# Patient Record
Sex: Female | Born: 1950
Health system: Southern US, Community
[De-identification: ages and names within clinical notes are randomized; demographics above are authoritative.]

## PROBLEM LIST (undated history)

## (undated) DIAGNOSIS — H269 Unspecified cataract: Secondary | ICD-10-CM

## (undated) DIAGNOSIS — I10 Essential (primary) hypertension: Secondary | ICD-10-CM

## (undated) DIAGNOSIS — J329 Chronic sinusitis, unspecified: Secondary | ICD-10-CM

## (undated) DIAGNOSIS — M810 Age-related osteoporosis without current pathological fracture: Secondary | ICD-10-CM

## (undated) HISTORY — DX: Age-related osteoporosis without current pathological fracture: M81.0

## (undated) HISTORY — DX: Chronic sinusitis, unspecified: J32.9

## (undated) HISTORY — DX: Unspecified cataract: H26.9

## (undated) HISTORY — DX: Essential (primary) hypertension: I10

---

## 1998-04-24 ENCOUNTER — Other Ambulatory Visit: Admission: RE | Admit: 1998-04-24 | Discharge: 1998-04-24 | Payer: Self-pay | Admitting: Obstetrics & Gynecology

## 1999-05-06 ENCOUNTER — Other Ambulatory Visit: Admission: RE | Admit: 1999-05-06 | Discharge: 1999-05-06 | Payer: Self-pay | Admitting: Obstetrics & Gynecology

## 2000-05-11 ENCOUNTER — Other Ambulatory Visit: Admission: RE | Admit: 2000-05-11 | Discharge: 2000-05-11 | Payer: Self-pay | Admitting: Obstetrics & Gynecology

## 2001-05-12 ENCOUNTER — Other Ambulatory Visit: Admission: RE | Admit: 2001-05-12 | Discharge: 2001-05-12 | Payer: Self-pay | Admitting: Obstetrics & Gynecology

## 2002-05-18 ENCOUNTER — Other Ambulatory Visit: Admission: RE | Admit: 2002-05-18 | Discharge: 2002-05-18 | Payer: Self-pay | Admitting: Obstetrics & Gynecology

## 2003-05-24 ENCOUNTER — Other Ambulatory Visit: Admission: RE | Admit: 2003-05-24 | Discharge: 2003-05-24 | Payer: Self-pay | Admitting: Obstetrics & Gynecology

## 2004-05-27 ENCOUNTER — Other Ambulatory Visit: Admission: RE | Admit: 2004-05-27 | Discharge: 2004-05-27 | Payer: Self-pay | Admitting: Obstetrics & Gynecology

## 2007-08-19 HISTORY — PX: COLONOSCOPY: SHX5424

## 2013-12-27 DIAGNOSIS — E559 Vitamin D deficiency, unspecified: Secondary | ICD-10-CM | POA: Diagnosis not present

## 2013-12-27 DIAGNOSIS — Z0000 Encounter for general adult medical examination without abnormal findings: Secondary | ICD-10-CM | POA: Diagnosis not present

## 2013-12-27 DIAGNOSIS — M859 Disorder of bone density and structure, unspecified: Secondary | ICD-10-CM | POA: Diagnosis not present

## 2013-12-27 DIAGNOSIS — I10 Essential (primary) hypertension: Secondary | ICD-10-CM | POA: Diagnosis not present

## 2013-12-30 DIAGNOSIS — Z23 Encounter for immunization: Secondary | ICD-10-CM | POA: Diagnosis not present

## 2013-12-30 DIAGNOSIS — I10 Essential (primary) hypertension: Secondary | ICD-10-CM | POA: Diagnosis not present

## 2013-12-30 DIAGNOSIS — M8580 Other specified disorders of bone density and structure, unspecified site: Secondary | ICD-10-CM | POA: Diagnosis not present

## 2013-12-30 DIAGNOSIS — E559 Vitamin D deficiency, unspecified: Secondary | ICD-10-CM | POA: Diagnosis not present

## 2014-03-01 DIAGNOSIS — E559 Vitamin D deficiency, unspecified: Secondary | ICD-10-CM | POA: Diagnosis not present

## 2014-05-02 DIAGNOSIS — Z1289 Encounter for screening for malignant neoplasm of other sites: Secondary | ICD-10-CM | POA: Diagnosis not present

## 2014-05-02 DIAGNOSIS — Z1231 Encounter for screening mammogram for malignant neoplasm of breast: Secondary | ICD-10-CM | POA: Diagnosis not present

## 2014-06-30 DIAGNOSIS — Z23 Encounter for immunization: Secondary | ICD-10-CM | POA: Diagnosis not present

## 2014-06-30 DIAGNOSIS — E559 Vitamin D deficiency, unspecified: Secondary | ICD-10-CM | POA: Diagnosis not present

## 2014-06-30 DIAGNOSIS — I10 Essential (primary) hypertension: Secondary | ICD-10-CM | POA: Diagnosis not present

## 2014-06-30 DIAGNOSIS — H6121 Impacted cerumen, right ear: Secondary | ICD-10-CM | POA: Diagnosis not present

## 2014-12-26 DIAGNOSIS — Z23 Encounter for immunization: Secondary | ICD-10-CM | POA: Diagnosis not present

## 2015-03-07 DIAGNOSIS — J40 Bronchitis, not specified as acute or chronic: Secondary | ICD-10-CM | POA: Diagnosis not present

## 2015-03-07 DIAGNOSIS — H6502 Acute serous otitis media, left ear: Secondary | ICD-10-CM | POA: Diagnosis not present

## 2015-03-07 DIAGNOSIS — J329 Chronic sinusitis, unspecified: Secondary | ICD-10-CM | POA: Diagnosis not present

## 2015-06-26 DIAGNOSIS — E559 Vitamin D deficiency, unspecified: Secondary | ICD-10-CM | POA: Diagnosis not present

## 2015-06-26 DIAGNOSIS — Z0000 Encounter for general adult medical examination without abnormal findings: Secondary | ICD-10-CM | POA: Diagnosis not present

## 2015-06-26 DIAGNOSIS — I10 Essential (primary) hypertension: Secondary | ICD-10-CM | POA: Diagnosis not present

## 2015-07-02 DIAGNOSIS — Q438 Other specified congenital malformations of intestine: Secondary | ICD-10-CM | POA: Diagnosis not present

## 2015-07-02 DIAGNOSIS — D485 Neoplasm of uncertain behavior of skin: Secondary | ICD-10-CM | POA: Diagnosis not present

## 2015-07-02 DIAGNOSIS — I10 Essential (primary) hypertension: Secondary | ICD-10-CM | POA: Diagnosis not present

## 2015-07-02 DIAGNOSIS — E559 Vitamin D deficiency, unspecified: Secondary | ICD-10-CM | POA: Diagnosis not present

## 2015-07-11 DIAGNOSIS — L820 Inflamed seborrheic keratosis: Secondary | ICD-10-CM | POA: Diagnosis not present

## 2015-07-11 DIAGNOSIS — Z1283 Encounter for screening for malignant neoplasm of skin: Secondary | ICD-10-CM | POA: Diagnosis not present

## 2015-12-18 DIAGNOSIS — Z23 Encounter for immunization: Secondary | ICD-10-CM | POA: Diagnosis not present

## 2016-04-18 DIAGNOSIS — H2511 Age-related nuclear cataract, right eye: Secondary | ICD-10-CM | POA: Diagnosis not present

## 2016-04-18 DIAGNOSIS — H25012 Cortical age-related cataract, left eye: Secondary | ICD-10-CM | POA: Diagnosis not present

## 2016-04-18 DIAGNOSIS — H2512 Age-related nuclear cataract, left eye: Secondary | ICD-10-CM | POA: Diagnosis not present

## 2016-04-18 DIAGNOSIS — H25011 Cortical age-related cataract, right eye: Secondary | ICD-10-CM | POA: Diagnosis not present

## 2016-04-18 DIAGNOSIS — H25041 Posterior subcapsular polar age-related cataract, right eye: Secondary | ICD-10-CM | POA: Diagnosis not present

## 2016-05-08 DIAGNOSIS — Z1231 Encounter for screening mammogram for malignant neoplasm of breast: Secondary | ICD-10-CM | POA: Diagnosis not present

## 2016-05-08 DIAGNOSIS — Z1289 Encounter for screening for malignant neoplasm of other sites: Secondary | ICD-10-CM | POA: Diagnosis not present

## 2016-06-10 DIAGNOSIS — H18411 Arcus senilis, right eye: Secondary | ICD-10-CM | POA: Diagnosis not present

## 2016-06-10 DIAGNOSIS — I10 Essential (primary) hypertension: Secondary | ICD-10-CM | POA: Diagnosis not present

## 2016-06-10 DIAGNOSIS — H2513 Age-related nuclear cataract, bilateral: Secondary | ICD-10-CM | POA: Diagnosis not present

## 2016-06-10 DIAGNOSIS — H2511 Age-related nuclear cataract, right eye: Secondary | ICD-10-CM | POA: Diagnosis not present

## 2016-06-10 DIAGNOSIS — H02839 Dermatochalasis of unspecified eye, unspecified eyelid: Secondary | ICD-10-CM | POA: Diagnosis not present

## 2016-07-28 DIAGNOSIS — I10 Essential (primary) hypertension: Secondary | ICD-10-CM | POA: Diagnosis not present

## 2016-07-28 DIAGNOSIS — E559 Vitamin D deficiency, unspecified: Secondary | ICD-10-CM | POA: Diagnosis not present

## 2016-07-31 DIAGNOSIS — Z23 Encounter for immunization: Secondary | ICD-10-CM | POA: Diagnosis not present

## 2016-07-31 DIAGNOSIS — M8580 Other specified disorders of bone density and structure, unspecified site: Secondary | ICD-10-CM | POA: Diagnosis not present

## 2016-07-31 DIAGNOSIS — M859 Disorder of bone density and structure, unspecified: Secondary | ICD-10-CM | POA: Diagnosis not present

## 2016-07-31 DIAGNOSIS — I10 Essential (primary) hypertension: Secondary | ICD-10-CM | POA: Diagnosis not present

## 2016-07-31 DIAGNOSIS — E559 Vitamin D deficiency, unspecified: Secondary | ICD-10-CM | POA: Diagnosis not present

## 2016-07-31 DIAGNOSIS — Z888 Allergy status to other drugs, medicaments and biological substances status: Secondary | ICD-10-CM | POA: Diagnosis not present

## 2016-07-31 DIAGNOSIS — Z823 Family history of stroke: Secondary | ICD-10-CM | POA: Diagnosis not present

## 2016-08-11 DIAGNOSIS — Z1211 Encounter for screening for malignant neoplasm of colon: Secondary | ICD-10-CM | POA: Diagnosis not present

## 2016-08-11 DIAGNOSIS — Z1212 Encounter for screening for malignant neoplasm of rectum: Secondary | ICD-10-CM | POA: Diagnosis not present

## 2016-08-18 DIAGNOSIS — H2511 Age-related nuclear cataract, right eye: Secondary | ICD-10-CM | POA: Diagnosis not present

## 2016-08-18 DIAGNOSIS — H2589 Other age-related cataract: Secondary | ICD-10-CM | POA: Diagnosis not present

## 2016-08-19 DIAGNOSIS — H2512 Age-related nuclear cataract, left eye: Secondary | ICD-10-CM | POA: Diagnosis not present

## 2016-09-08 DIAGNOSIS — H2512 Age-related nuclear cataract, left eye: Secondary | ICD-10-CM | POA: Diagnosis not present

## 2017-01-09 DIAGNOSIS — Z23 Encounter for immunization: Secondary | ICD-10-CM | POA: Diagnosis not present

## 2017-05-11 DIAGNOSIS — Z1231 Encounter for screening mammogram for malignant neoplasm of breast: Secondary | ICD-10-CM | POA: Diagnosis not present

## 2017-08-06 DIAGNOSIS — E559 Vitamin D deficiency, unspecified: Secondary | ICD-10-CM | POA: Diagnosis not present

## 2017-08-06 DIAGNOSIS — I10 Essential (primary) hypertension: Secondary | ICD-10-CM | POA: Diagnosis not present

## 2017-08-13 DIAGNOSIS — E559 Vitamin D deficiency, unspecified: Secondary | ICD-10-CM | POA: Diagnosis not present

## 2017-08-13 DIAGNOSIS — M8580 Other specified disorders of bone density and structure, unspecified site: Secondary | ICD-10-CM | POA: Diagnosis not present

## 2017-08-13 DIAGNOSIS — Z1211 Encounter for screening for malignant neoplasm of colon: Secondary | ICD-10-CM | POA: Diagnosis not present

## 2017-08-13 DIAGNOSIS — Q438 Other specified congenital malformations of intestine: Secondary | ICD-10-CM | POA: Diagnosis not present

## 2017-08-13 DIAGNOSIS — Z888 Allergy status to other drugs, medicaments and biological substances status: Secondary | ICD-10-CM | POA: Diagnosis not present

## 2017-08-13 DIAGNOSIS — I10 Essential (primary) hypertension: Secondary | ICD-10-CM | POA: Diagnosis not present

## 2017-08-13 DIAGNOSIS — Z0000 Encounter for general adult medical examination without abnormal findings: Secondary | ICD-10-CM | POA: Diagnosis not present

## 2017-08-13 DIAGNOSIS — Z823 Family history of stroke: Secondary | ICD-10-CM | POA: Diagnosis not present

## 2017-12-23 DIAGNOSIS — Z23 Encounter for immunization: Secondary | ICD-10-CM | POA: Diagnosis not present

## 2017-12-28 DIAGNOSIS — Z961 Presence of intraocular lens: Secondary | ICD-10-CM | POA: Diagnosis not present

## 2017-12-28 DIAGNOSIS — H26493 Other secondary cataract, bilateral: Secondary | ICD-10-CM | POA: Diagnosis not present

## 2018-06-07 DIAGNOSIS — Z6823 Body mass index (BMI) 23.0-23.9, adult: Secondary | ICD-10-CM | POA: Diagnosis not present

## 2018-06-07 DIAGNOSIS — Z1231 Encounter for screening mammogram for malignant neoplasm of breast: Secondary | ICD-10-CM | POA: Diagnosis not present

## 2018-06-07 DIAGNOSIS — Z124 Encounter for screening for malignant neoplasm of cervix: Secondary | ICD-10-CM | POA: Diagnosis not present

## 2018-06-08 ENCOUNTER — Other Ambulatory Visit: Payer: Self-pay | Admitting: Obstetrics & Gynecology

## 2018-06-08 DIAGNOSIS — R928 Other abnormal and inconclusive findings on diagnostic imaging of breast: Secondary | ICD-10-CM

## 2018-06-10 ENCOUNTER — Ambulatory Visit
Admission: RE | Admit: 2018-06-10 | Discharge: 2018-06-10 | Disposition: A | Discharge status: Home / Self Care | Payer: Medicare Other | Source: Ambulatory Visit | Attending: Obstetrics & Gynecology | Admitting: Obstetrics & Gynecology

## 2018-06-10 ENCOUNTER — Other Ambulatory Visit: Payer: Self-pay

## 2018-06-10 DIAGNOSIS — R928 Other abnormal and inconclusive findings on diagnostic imaging of breast: Secondary | ICD-10-CM

## 2018-08-13 DIAGNOSIS — I10 Essential (primary) hypertension: Secondary | ICD-10-CM | POA: Diagnosis not present

## 2018-08-20 DIAGNOSIS — J302 Other seasonal allergic rhinitis: Secondary | ICD-10-CM | POA: Diagnosis not present

## 2018-08-20 DIAGNOSIS — M8580 Other specified disorders of bone density and structure, unspecified site: Secondary | ICD-10-CM | POA: Diagnosis not present

## 2018-08-20 DIAGNOSIS — I10 Essential (primary) hypertension: Secondary | ICD-10-CM | POA: Diagnosis not present

## 2018-08-20 DIAGNOSIS — Z1159 Encounter for screening for other viral diseases: Secondary | ICD-10-CM | POA: Diagnosis not present

## 2018-08-20 DIAGNOSIS — M810 Age-related osteoporosis without current pathological fracture: Secondary | ICD-10-CM | POA: Diagnosis not present

## 2018-08-20 DIAGNOSIS — Z0000 Encounter for general adult medical examination without abnormal findings: Secondary | ICD-10-CM | POA: Diagnosis not present

## 2018-08-20 DIAGNOSIS — E559 Vitamin D deficiency, unspecified: Secondary | ICD-10-CM | POA: Diagnosis not present

## 2018-10-15 ENCOUNTER — Other Ambulatory Visit: Payer: Self-pay

## 2018-12-10 DIAGNOSIS — Z23 Encounter for immunization: Secondary | ICD-10-CM | POA: Diagnosis not present

## 2018-12-28 DIAGNOSIS — Z20828 Contact with and (suspected) exposure to other viral communicable diseases: Secondary | ICD-10-CM | POA: Diagnosis not present

## 2019-04-28 ENCOUNTER — Ambulatory Visit: Payer: Medicare Other

## 2019-06-23 DIAGNOSIS — Z1231 Encounter for screening mammogram for malignant neoplasm of breast: Secondary | ICD-10-CM | POA: Diagnosis not present

## 2019-07-04 DIAGNOSIS — H1045 Other chronic allergic conjunctivitis: Secondary | ICD-10-CM | POA: Diagnosis not present

## 2019-08-22 DIAGNOSIS — I10 Essential (primary) hypertension: Secondary | ICD-10-CM | POA: Diagnosis not present

## 2019-08-26 DIAGNOSIS — I10 Essential (primary) hypertension: Secondary | ICD-10-CM | POA: Diagnosis not present

## 2019-08-26 DIAGNOSIS — J302 Other seasonal allergic rhinitis: Secondary | ICD-10-CM | POA: Diagnosis not present

## 2019-08-26 DIAGNOSIS — M8580 Other specified disorders of bone density and structure, unspecified site: Secondary | ICD-10-CM | POA: Diagnosis not present

## 2019-08-26 DIAGNOSIS — Z823 Family history of stroke: Secondary | ICD-10-CM | POA: Diagnosis not present

## 2019-08-26 DIAGNOSIS — F5104 Psychophysiologic insomnia: Secondary | ICD-10-CM | POA: Diagnosis not present

## 2019-08-26 DIAGNOSIS — M810 Age-related osteoporosis without current pathological fracture: Secondary | ICD-10-CM | POA: Diagnosis not present

## 2019-08-26 DIAGNOSIS — Z0000 Encounter for general adult medical examination without abnormal findings: Secondary | ICD-10-CM | POA: Diagnosis not present

## 2019-08-26 DIAGNOSIS — Z888 Allergy status to other drugs, medicaments and biological substances status: Secondary | ICD-10-CM | POA: Diagnosis not present

## 2019-08-26 DIAGNOSIS — Q438 Other specified congenital malformations of intestine: Secondary | ICD-10-CM | POA: Diagnosis not present

## 2019-08-26 DIAGNOSIS — N3289 Other specified disorders of bladder: Secondary | ICD-10-CM | POA: Diagnosis not present

## 2019-08-26 DIAGNOSIS — E559 Vitamin D deficiency, unspecified: Secondary | ICD-10-CM | POA: Diagnosis not present

## 2019-09-10 LAB — EXTERNAL GENERIC LAB PROCEDURE: COLOGUARD: NEGATIVE

## 2019-12-14 DIAGNOSIS — Z23 Encounter for immunization: Secondary | ICD-10-CM | POA: Diagnosis not present

## 2020-01-20 DIAGNOSIS — Z23 Encounter for immunization: Secondary | ICD-10-CM | POA: Diagnosis not present

## 2020-01-24 DIAGNOSIS — D1801 Hemangioma of skin and subcutaneous tissue: Secondary | ICD-10-CM | POA: Diagnosis not present

## 2020-01-24 DIAGNOSIS — L728 Other follicular cysts of the skin and subcutaneous tissue: Secondary | ICD-10-CM | POA: Diagnosis not present

## 2020-01-24 DIAGNOSIS — L814 Other melanin hyperpigmentation: Secondary | ICD-10-CM | POA: Diagnosis not present

## 2020-01-24 DIAGNOSIS — L821 Other seborrheic keratosis: Secondary | ICD-10-CM | POA: Diagnosis not present

## 2020-01-24 DIAGNOSIS — D225 Melanocytic nevi of trunk: Secondary | ICD-10-CM | POA: Diagnosis not present

## 2020-06-17 DIAGNOSIS — J019 Acute sinusitis, unspecified: Secondary | ICD-10-CM | POA: Diagnosis not present

## 2020-06-17 DIAGNOSIS — B9689 Other specified bacterial agents as the cause of diseases classified elsewhere: Secondary | ICD-10-CM | POA: Diagnosis not present

## 2020-07-05 DIAGNOSIS — Z124 Encounter for screening for malignant neoplasm of cervix: Secondary | ICD-10-CM | POA: Diagnosis not present

## 2020-07-05 DIAGNOSIS — Z1231 Encounter for screening mammogram for malignant neoplasm of breast: Secondary | ICD-10-CM | POA: Diagnosis not present

## 2020-07-05 DIAGNOSIS — Z6823 Body mass index (BMI) 23.0-23.9, adult: Secondary | ICD-10-CM | POA: Diagnosis not present

## 2020-08-08 DIAGNOSIS — J069 Acute upper respiratory infection, unspecified: Secondary | ICD-10-CM | POA: Diagnosis not present

## 2020-08-24 DIAGNOSIS — M810 Age-related osteoporosis without current pathological fracture: Secondary | ICD-10-CM | POA: Diagnosis not present

## 2020-08-24 DIAGNOSIS — I10 Essential (primary) hypertension: Secondary | ICD-10-CM | POA: Diagnosis not present

## 2020-08-31 DIAGNOSIS — D75839 Thrombocytosis, unspecified: Secondary | ICD-10-CM | POA: Diagnosis not present

## 2020-08-31 DIAGNOSIS — R059 Cough, unspecified: Secondary | ICD-10-CM | POA: Diagnosis not present

## 2020-08-31 DIAGNOSIS — M810 Age-related osteoporosis without current pathological fracture: Secondary | ICD-10-CM | POA: Diagnosis not present

## 2020-08-31 DIAGNOSIS — I10 Essential (primary) hypertension: Secondary | ICD-10-CM | POA: Diagnosis not present

## 2020-08-31 DIAGNOSIS — N3289 Other specified disorders of bladder: Secondary | ICD-10-CM | POA: Diagnosis not present

## 2020-08-31 DIAGNOSIS — F5104 Psychophysiologic insomnia: Secondary | ICD-10-CM | POA: Diagnosis not present

## 2020-08-31 DIAGNOSIS — M8580 Other specified disorders of bone density and structure, unspecified site: Secondary | ICD-10-CM | POA: Diagnosis not present

## 2020-08-31 DIAGNOSIS — J302 Other seasonal allergic rhinitis: Secondary | ICD-10-CM | POA: Diagnosis not present

## 2020-08-31 DIAGNOSIS — Z0000 Encounter for general adult medical examination without abnormal findings: Secondary | ICD-10-CM | POA: Diagnosis not present

## 2020-10-16 ENCOUNTER — Encounter: Payer: Self-pay | Admitting: Internal Medicine

## 2020-10-16 ENCOUNTER — Ambulatory Visit (INDEPENDENT_AMBULATORY_CARE_PROVIDER_SITE_OTHER): Payer: Medicare Other | Admitting: Internal Medicine

## 2020-10-16 ENCOUNTER — Other Ambulatory Visit: Payer: Self-pay

## 2020-10-16 DIAGNOSIS — R058 Other specified cough: Secondary | ICD-10-CM

## 2020-10-16 LAB — CBC WITH DIFFERENTIAL/PLATELET
Basophils Absolute: 0 10*3/uL (ref 0.0–0.1)
Basophils Relative: 0.3 % (ref 0.0–3.0)
Eosinophils Absolute: 0 10*3/uL (ref 0.0–0.7)
Eosinophils Relative: 0.3 % (ref 0.0–5.0)
HCT: 40.6 % (ref 36.0–46.0)
Hemoglobin: 13.4 g/dL (ref 12.0–15.0)
Lymphocytes Relative: 9.7 % — ABNORMAL LOW (ref 12.0–46.0)
Lymphs Abs: 0.8 10*3/uL (ref 0.7–4.0)
MCHC: 32.9 g/dL (ref 30.0–36.0)
MCV: 82.4 fl (ref 78.0–100.0)
Monocytes Absolute: 0.3 10*3/uL (ref 0.1–1.0)
Monocytes Relative: 3.5 % (ref 3.0–12.0)
Neutro Abs: 6.9 10*3/uL (ref 1.4–7.7)
Neutrophils Relative %: 86.2 % — ABNORMAL HIGH (ref 43.0–77.0)
Platelets: 289 10*3/uL (ref 150.0–400.0)
RBC: 4.93 Mil/uL (ref 3.87–5.11)
RDW: 13.6 % (ref 11.5–15.5)
WBC: 8 10*3/uL (ref 4.0–10.5)

## 2020-10-16 MED ORDER — FAMOTIDINE 20 MG PO TABS: ORAL_TABLET | ORAL | 11 refills | Status: DC

## 2020-10-16 MED ORDER — PANTOPRAZOLE SODIUM 40 MG PO TBEC: 40.0000 mg | DELAYED_RELEASE_TABLET | Freq: Every day | ORAL | 2 refills | Status: DC

## 2020-10-16 MED ORDER — PREDNISONE 10 MG PO TABS: ORAL_TABLET | ORAL | 0 refills | Status: DC

## 2020-10-16 NOTE — Addendum Note (Signed)
Addended by: Suzzanne Cloud E on: 10/16/2020 10:44 AM   Modules accepted: Orders

## 2020-10-16 NOTE — Assessment & Plan Note (Signed)
Onset late April 2022  with ? Sinus infection - Allergy profile 10/16/2020 >  Eos 0. /  IgE  Pending  -  Sinus CT  10/16/2020 >>> ordered   The most common causes of chronic cough in immunocompetent adults include the following: upper airway cough syndrome (UACS), previously referred to as postnasal drip syndrome (PNDS), which is caused by variety of rhinosinus conditions; (2) asthma; (3) GERD; (4) chronic bronchitis from cigarette smoking or other inhaled environmental irritants; (5) nonasthmatic eosinophilic bronchitis; and (6) bronchiectasis.   These conditions, singly or in combination, have accounted for up to 94% of the causes of chronic cough in prospective studies.   Other conditions have constituted no >6% of the causes in prospective studies These have included bronchogenic carcinoma, chronic interstitial pneumonia, sarcoidosis, left ventricular failure, ACEI-induced cough, and aspiration from a condition associated with pharyngeal dysfunction.    Chronic cough is often simultaneously caused by more than one condition. A single cause has been found from 38 to 82% of the time, multiple causes from 18 to 62%. Multiply caused cough has been the result of three diseases up to 42% of the time.      Of the three most common causes of  Sub-acute / recurrent or chronic cough, only one (GERD)  can actually contribute to/ trigger  the other two (asthma and post nasal drip syndrome)  and perpetuate the cylce of cough.  While not intuitively obvious, many patients with chronic low grade reflux do not cough until there is a primary insult that disturbs the protective epithelial barrier and exposes sensitive nerve endings.   This is typically viral but can due to PNDS related to sinus dz  and latter more likely to   apply here.     >>>  The point is that once this occurs, it is difficult to eliminate the cycle  using anything but a maximally effective acid suppression regimen at least in the short run,  accompanied by an appropriate diet to address non acid GERD and control / eliminate the cough itself for at least 3 days with delsym and avoid narcotics if possible  >>> also so added 6 day taper off  Prednisone starting at 40 mg per day in case of component of Th-2 driven upper or lower airways inflammation (if cough responds short term only to relapse before return while will on full rx for uacs (as above), then  that would point to allergic rhinitis/ asthma or eos bronchitis as alternative dx)           Each maintenance medication was reviewed in detail including emphasizing most importantly the difference between maintenance and prns and under what circumstances the prns are to be triggered using an action plan format where appropriate.  Total time for H and P, chart review, counseling,   and generating customized AVS unique to this new pt  office visit / same day charting = 40 min

## 2020-10-16 NOTE — Progress Notes (Signed)
Patricia Neal, female    DOB: 04-03-1950      MRN: [id number]   Brief patient profile:  70 yo white white female  never smoker with HBP only chronic medical problem  referred to pulmonary clinic 10/16/2020 by Dr   Shelia Media for chronic cough developed abruptly in  late April 2022 in setting of a new problem sinus infection = bilateral upper tooth pain and nasal congestion  rx with prednisone and augmentin >  completely gone but cough persisted   History of Present Illness  10/16/2020  Pulmonary/ 1st office eval/Jasher Barkan  Chief Complaint  Patient presents with   Consult    Referred by Dr. Deland Pretty. Had cough since May 2022.    Dyspnea:  no sob  Cough: dry  day > noct  Sleep: no problem with cough noct or in am  SABA use: none  but uses flonase  DR Shelia Media June 17  pred, tessalon and codeine > 100% gone off  all meds for a week Has sense of pnds  Strong smells make her cough  No cough with laugh or singing or extremes of temp   No obvious day to day or daytime variability or assoc excess/ purulent sputum or mucus plugs or hemoptysis or cp or chest tightness, subjective wheeze or overt  hb symptoms.   Sleeping  without nocturnal  or early am exacerbation  of respiratory  c/o's or need for noct saba. Also denies any obvious fluctuation of symptoms with weather or environmental changes or other aggravating or alleviating factors except as outlined above   No unusual exposure hx or h/o childhood pna/ asthma or knowledge of premature birth.  Current Allergies, Complete Past Medical History, Past Surgical History, Family History, and Social History were reviewed in [employer] record.  ROS  The following are not active complaints unless bolded Hoarseness, sore throat, dysphagia, dental problems, itching, sneezing,  nasal congestion or discharge of excess mucus or purulent secretions, ear ache,   fever, chills, sweats, unintended wt loss or wt gain, classically pleuritic  or exertional cp,  orthopnea pnd or arm/hand swelling  or leg swelling, presyncope, palpitations, abdominal pain, anorexia, nausea, vomiting, diarrhea  or change in bowel habits or change in bladder habits, change in stools or change in urine, dysuria, hematuria,  rash, arthralgias, visual complaints, headache, numbness, weakness or ataxia or problems with walking or coordination,  change in mood or  memory.           No past medical history on file.  Outpatient Medications Prior to Visit  Medication Sig Dispense Refill   cholecalciferol (VITAMIN D3) 25 MCG (1000 UNIT) tablet Take 1,000 Units by mouth daily.     losartan (COZAAR) 50 MG tablet Take 50 mg by mouth daily.     No facility-administered medications prior to visit.     Objective:     BP (!) 154/92 (BP Location: Left Arm, Cuff Size: Normal)   Pulse 100   Temp 99.6 F (37.6 C) (Oral)   Ht '5\' 1"'$  (1.549 m)   Wt 126 lb (57.2 kg)   SpO2 97% Comment: on ra  BMI 23.81 kg/m   SpO2: 97 % (on ra)  Amb wf dry cough   HEENT : no significant pnds / no cobblestoning    NECK :  without JVD/Nodes/TM/ nl carotid upstrokes bilaterally   LUNGS: no acc muscle use,  Nl contour chest which is clear to A and P bilaterally without cough on  insp or exp maneuvers   CV:  RRR  no s3 or murmur or increase in P2, and no edema   ABD:  soft and nontender with nl inspiratory excursion in the supine position. No bruits or organomegaly appreciated, bowel sounds nl  MS:  Nl gait/ ext warm without deformities, calf tenderness, cyanosis or clubbing No obvious joint restrictions   SKIN: warm and dry without lesions    NEURO:  alert, approp, nl sensorium with  no motor or cerebellar deficits apparent.     I personally reviewed images and agree with radiology impression as follows:  CXR:   08/31/20 pa and lateral Wnl      Assessment   No problem-specific Assessment & Plan notes found for this encounter.     Patricia Gully,  MD 10/16/2020

## 2020-10-16 NOTE — Patient Instructions (Addendum)
Pantoprazole (protonix) 40 mg   Take  30-60 min before first meal of the day and Pepcid (famotidine)  20 mg after supper until return to office - this is the best way to tell whether stomach acid is contributing to your problem.    GERD (REFLUX)  is an extremely common cause of respiratory symptoms just like yours , many times with no obvious heartburn at all.    It can be treated with medication, but also with lifestyle changes including elevation of the head of your bed (ideally with 6 -8inch blocks under the headboard of your bed),  Smoking cessation, avoidance of late meals, excessive alcohol, and avoid fatty foods, chocolate, peppermint, colas, red wine, and acidic juices such as orange juice.  NO MINT OR MENTHOL PRODUCTS SO NO COUGH DROPS  USE SUGARLESS CANDY INSTEAD (Jolley ranchers or Stover's or Life Savers) or even ice chips will also do - the key is to swallow to prevent all throat clearing. NO OIL BASED VITAMINS - use powdered substitutes.  Avoid fish oil when coughing.   Prednisone 10 mg take  4 each am x 2 days,   2 each am x 2 days,  1 each am x 2 days and stop  For cough > delsym 2 tsp every 12 hours as needed   Please remember to go to the lab department   for your tests - we will call you with the results when they are available.  We will call to schedule you a sinus ct or ent evaluation if your insurance prefers.

## 2020-10-17 ENCOUNTER — Telehealth: Payer: Self-pay | Admitting: Internal Medicine

## 2020-10-17 LAB — IGE: IgE (Immunoglobulin E), Serum: 150 kU/L — ABNORMAL HIGH (ref <=114)

## 2020-10-17 MED ORDER — RABEPRAZOLE SODIUM 20 MG PO TBEC: 20.0000 mg | DELAYED_RELEASE_TABLET | Freq: Every day | ORAL | 0 refills | Status: DC

## 2020-10-17 NOTE — Telephone Encounter (Signed)
Extremely unusual side effects - would try stopping both for at least 48 hours to make sure it's not a virus and once all better try  aking aciphex 30 min before 1st meal of the day to see if that's going to be tolerated but only rx x 7 day trial period ( it causes the least GI side effects of all the reflux meds)

## 2020-10-17 NOTE — Telephone Encounter (Signed)
Patient called and stated she started having cramps and gas when taking the famotidine last night and vomited this morning when taking the pantoprazole. Patient is wondering if this is normal side effects or should she stop taking it. Will route to Dr. Melvyn Novas for recs.  Dr. Melvyn Novas. Please advise. Thanks!

## 2020-10-17 NOTE — Telephone Encounter (Signed)
I called and spoke with patient regardng Dr. Ammie Dalton. Patient verbalized understanding and I sent in Aciphex to preferred pharmacy and instructed patient on Dr, Melvyn Novas recs and to let us know how it is tolerated. Patient verbalized understanding, nothing further needed.

## 2020-10-18 NOTE — Progress Notes (Signed)
lmtcb

## 2020-10-19 ENCOUNTER — Telehealth: Payer: Self-pay | Admitting: Internal Medicine

## 2020-10-19 NOTE — Telephone Encounter (Signed)
Called and spoke with Patient.  Dr. Gustavus Bryant lab results and recommendations given. Understanding stated.  Nothing further at this time.

## 2020-10-22 ENCOUNTER — Ambulatory Visit
Admission: RE | Admit: 2020-10-22 | Discharge: 2020-10-22 | Disposition: A | Discharge status: Home / Self Care | Payer: Medicare Other | Source: Ambulatory Visit | Attending: Internal Medicine | Admitting: Internal Medicine

## 2020-10-22 ENCOUNTER — Other Ambulatory Visit: Payer: Self-pay

## 2020-10-22 DIAGNOSIS — J329 Chronic sinusitis, unspecified: Secondary | ICD-10-CM | POA: Diagnosis not present

## 2020-10-22 DIAGNOSIS — R058 Other specified cough: Secondary | ICD-10-CM

## 2020-10-22 DIAGNOSIS — J342 Deviated nasal septum: Secondary | ICD-10-CM | POA: Diagnosis not present

## 2020-10-24 ENCOUNTER — Telehealth: Payer: Self-pay | Admitting: Internal Medicine

## 2020-10-24 DIAGNOSIS — J328 Other chronic sinusitis: Secondary | ICD-10-CM

## 2020-10-24 NOTE — Telephone Encounter (Signed)
Pt is returning phone call in regards to CT results. Pls regard; [phone].

## 2020-10-24 NOTE — Telephone Encounter (Signed)
Call made to patient, confirmed DOB. Made aware of CT results:     Patient reports she still feels congested with the tickle and nasal drip at the back of her throat but her cough is much better. Denies any acute symptoms or purulent drainage.   Order has been placed for ENT. Office number given to patient. Instructed to call if she has not heard anything in about 2-3 weeks. Voiced understanding.   Nothing further needed at this time.

## 2020-10-24 NOTE — Progress Notes (Signed)
LMTCB for the pt 

## 2020-11-15 DIAGNOSIS — J322 Chronic ethmoidal sinusitis: Secondary | ICD-10-CM | POA: Diagnosis not present

## 2020-11-15 DIAGNOSIS — J320 Chronic maxillary sinusitis: Secondary | ICD-10-CM | POA: Diagnosis not present

## 2020-11-15 DIAGNOSIS — J342 Deviated nasal septum: Secondary | ICD-10-CM | POA: Diagnosis not present

## 2020-12-31 DIAGNOSIS — J322 Chronic ethmoidal sinusitis: Secondary | ICD-10-CM | POA: Diagnosis not present

## 2020-12-31 DIAGNOSIS — J342 Deviated nasal septum: Secondary | ICD-10-CM | POA: Diagnosis not present

## 2020-12-31 DIAGNOSIS — J320 Chronic maxillary sinusitis: Secondary | ICD-10-CM | POA: Diagnosis not present

## 2020-12-31 DIAGNOSIS — T8130XA Disruption of wound, unspecified, initial encounter: Secondary | ICD-10-CM | POA: Diagnosis not present

## 2021-01-03 DIAGNOSIS — Z23 Encounter for immunization: Secondary | ICD-10-CM | POA: Diagnosis not present

## 2021-02-26 DIAGNOSIS — Z23 Encounter for immunization: Secondary | ICD-10-CM | POA: Diagnosis not present

## 2021-07-09 DIAGNOSIS — Z1231 Encounter for screening mammogram for malignant neoplasm of breast: Secondary | ICD-10-CM | POA: Diagnosis not present

## 2021-08-30 DIAGNOSIS — D75839 Thrombocytosis, unspecified: Secondary | ICD-10-CM | POA: Diagnosis not present

## 2021-08-30 DIAGNOSIS — I10 Essential (primary) hypertension: Secondary | ICD-10-CM | POA: Diagnosis not present

## 2021-08-30 DIAGNOSIS — M810 Age-related osteoporosis without current pathological fracture: Secondary | ICD-10-CM | POA: Diagnosis not present

## 2021-09-04 DIAGNOSIS — Z0000 Encounter for general adult medical examination without abnormal findings: Secondary | ICD-10-CM | POA: Diagnosis not present

## 2021-09-04 DIAGNOSIS — M810 Age-related osteoporosis without current pathological fracture: Secondary | ICD-10-CM | POA: Diagnosis not present

## 2021-09-04 DIAGNOSIS — K5900 Constipation, unspecified: Secondary | ICD-10-CM | POA: Diagnosis not present

## 2021-09-04 DIAGNOSIS — E7800 Pure hypercholesterolemia, unspecified: Secondary | ICD-10-CM | POA: Diagnosis not present

## 2021-09-04 DIAGNOSIS — Q438 Other specified congenital malformations of intestine: Secondary | ICD-10-CM | POA: Diagnosis not present

## 2021-09-05 ENCOUNTER — Other Ambulatory Visit: Payer: Self-pay | Admitting: Internal Medicine

## 2021-09-05 DIAGNOSIS — E7800 Pure hypercholesterolemia, unspecified: Secondary | ICD-10-CM

## 2021-10-07 ENCOUNTER — Encounter: Payer: Self-pay | Admitting: Gastroenterology

## 2021-10-10 NOTE — Progress Notes (Signed)
Referring Provider: Deland Pretty, MD Primary Care Physician:  Deland Pretty, MD   Reason for Consultation: Change in bowel habits   IMPRESSION:  Change in bowel habits starting in April with associated abdominal pain and bloating    -Now with primarily constipation with sense of incomplete evacuation and straining    -Normal calcium on 08/2021 Labs    -No recent abdominal imaging    -Recent, acute symptom onset without changes in diet or medications is concerning Incomplete screening colonoscopy with Dr. Amalia Hailey at [facility] 2009    -Attributed to tortuous colon    -Unsuccessful despite use of pediatric colonoscope    -Conscious sedation used at that time  PLAN: - TSH - Continue with daily Miralax in the meantime - No need to avoid nuts, seeds, or berries - Colonoscopy to evaluate for organic GI disease including polyp or mass - Cross-sectional imaging +/- anorectal manometry if colonoscopy is negative   HPI: Patricia Neal is a 71 y.o. female referred by Dr. Shelia Media for new onset constipation.  The history is obtained through the patient, records provided by Dr. Shelia Media, and review of limited records available in her epic electronic health record. History of hypertension, chronic sinusitis, and osteoporosis.   Presents today with concerns for altered bowel habits, bloating, and abdominal pain described as an ache that improves with defecation. Bowel habits range from diarrhea to small, hard stools initially started around April. She has associated straining and a sense of incomplete evacuation. In April it was so bad she couldn't pass a stool. When she finally did have a bowel movement there was some bleeding. No further bleeding since that time.   She has tried to avoid granola, nuts, and popcorn but that didn't make a choice. Even drinking more water than normal.  Working 3 days a week at [employer] doing yoga, exercise, and weights.   She started MiraLAX for constipation  09/05/2021.  She discontinued the medication after 3 weeks. Then developed diarrhea 09/30/2021.  Constipation returned and she resumed MiraLAX 10/07/2021.  Took Clindamycin for sinusitis last year with some associated diarrhea at that time - but recent bowel changes developed many months later.  Colonoscopy for colon cancer screening and hematochezia performed with Dr. Amalia Hailey at [facility] 08/19/2007 showed a tortuous colon with significant looping in the lower pelvis.  Despite multiple position changes, pressure, and switching to a pediatric colonoscope the procedure could not be performed to be on the transverse colon. Procedure was performed with conscious sedation. Barium enema was recommended for screening and was normal that same year.  Labs 08/30/2021 show a normal CBC with a hemoglobin of 13.5, MCV 83.9, RDW 41.3, platelets 331 and a normal CMP except for a BUN of 21  Cologuard was negative in 2018 and 2021.  No recent abdominal imaging.   She just feels like something isn't right.  Weight is stable. Appetite is great.   There is no known family history of colon cancer or polyps. No family history of stomach cancer or other GI malignancy. No family history of inflammatory bowel disease or celiac.    Past Medical History:  Diagnosis Date   Hypertension    Sinusitis     Past Surgical History:  Procedure Laterality Date   COLONOSCOPY  08/19/2007   [place] tortuous colon per patient      Current Outpatient Medications  Medication Sig Dispense Refill   cholecalciferol (VITAMIN D3) 25 MCG (1000 UNIT) tablet Take 1,000 Units by  mouth daily.     losartan (COZAAR) 50 MG tablet Take 50 mg by mouth daily.     Multiple Vitamin (MULTIVITAMIN) capsule Take 1 capsule by mouth as needed.     polyethylene glycol (MIRALAX / GLYCOLAX) 17 g packet Take 17 g by mouth every other day.     No current facility-administered medications for this visit.    Allergies as of 10/11/2021 -  Review Complete 10/11/2021  Allergen Reaction Noted   Aspirin Other (See Comments) and Swelling 03/07/2015    Family History  Problem Relation Age of Onset   Colon cancer Neg Hx    Esophageal cancer Neg Hx     Social History   Socioeconomic History   Marital status: Married    Spouse name: Not on file   Number of children: Not on file   Years of education: Not on file   Highest education level: Not on file  Occupational History   Not on file  Tobacco Use   Smoking status: Never   Smokeless tobacco: Never  Vaping Use   Vaping Use: Never used  Substance and Sexual Activity   Alcohol use: Yes    Comment: Occassionally maybe 1 glass of wine on the weekends   Drug use: Never   Sexual activity: Not on file  Other Topics Concern   Not on file  Social History Narrative   Not on file   Social Determinants of Health   Financial Resource Strain: Not on file  Food Insecurity: Not on file  Transportation Needs: Not on file  Physical Activity: Not on file  Stress: Not on file  Social Connections: Not on file  Intimate Partner Violence: Not on file    Review of Systems: 12 system ROS is negative except as noted above.   Physical Exam: General:   Alert,  well-nourished, pleasant and cooperative in NAD Head:  Normocephalic and atraumatic. Eyes:  Sclera clear, no icterus.   Conjunctiva pink. Ears:  Normal auditory acuity. Nose:  No deformity, discharge,  or lesions. Mouth:  No deformity or lesions.   Neck:  Supple; no masses or thyromegaly. Lungs:  Clear throughout to auscultation.   No wheezes. Heart:  Regular rate and rhythm; no murmurs. Abdomen:  Soft, nontender, nondistended, normal bowel sounds, no rebound or guarding. No hepatosplenomegaly.   Rectal:  Deferred  Msk:  Symmetrical. No boney deformities LAD: No inguinal or umbilical LAD Extremities:  No clubbing or edema. Neurologic:  Alert and  oriented x4;  grossly nonfocal Skin:  Intact without significant  lesions or rashes. Psych:  Alert and cooperative. Normal mood and affect.    Eason Housman L. Tarri Glenn, MD, MPH 10/11/2021, 8:37 AM

## 2021-10-11 ENCOUNTER — Encounter: Payer: Self-pay | Admitting: Gastroenterology

## 2021-10-11 ENCOUNTER — Other Ambulatory Visit (INDEPENDENT_AMBULATORY_CARE_PROVIDER_SITE_OTHER): Payer: Medicare Other

## 2021-10-11 ENCOUNTER — Ambulatory Visit (INDEPENDENT_AMBULATORY_CARE_PROVIDER_SITE_OTHER): Payer: Medicare Other | Admitting: Gastroenterology

## 2021-10-11 VITALS — BP 136/76 | HR 66 | Ht 61.0 in | Wt 126.2 lb

## 2021-10-11 DIAGNOSIS — R140 Abdominal distension (gaseous): Secondary | ICD-10-CM

## 2021-10-11 DIAGNOSIS — R109 Unspecified abdominal pain: Secondary | ICD-10-CM | POA: Diagnosis not present

## 2021-10-11 DIAGNOSIS — R194 Change in bowel habit: Secondary | ICD-10-CM

## 2021-10-11 LAB — TSH: TSH: 1.45 u[IU]/mL (ref 0.35–5.50)

## 2021-10-11 NOTE — Patient Instructions (Addendum)
It was my pleasure to provide care to you today. Based on our discussion, I am providing you with my recommendations below:  RECOMMENDATION(S):   Please stop in the lab to have your thyroid checked today.   We discussed different options for evaluation including a colonoscopy and CT scan. There are advantages to each. Given the change in anesthesia available since your last colonoscopy, I believe your experience can be different even with your tortuous colon.  If a colonoscopy is not-diagnostic, we will proceed with additional testing or imaging based on the results at the time of the procedure.   In the meantime, continue to use Miralax daily as long as it is not giving you diarrhea. An alternative would be Metamucil or Benefiber if you find that you are having too many bowel movements or you find them too loose.   There is no need to avoid nuts, seeds, popcorn or berries. A high fiber diet with fruits, vegetables, and bran fibers is recommended. Please drink at least 64 ounces of water daily.   LABS:   Please proceed to the basement level for lab work before leaving today. Press "B" on the elevator. The lab is located at the first door on the left as you exit the elevator.  FOLLOW UP:   After your procedure, you will receive a call from my office staff regarding my recommendation for follow up.  BMI:  If you are age 8 or older, your body mass index should be between 23-30. Your Body mass index is 23.85 kg/m. If this is out of the aforementioned range listed, please consider follow up with your Primary Care Provider.   MY CHART:  The [facility] providers would like to encourage you to use [facility] to communicate with providers for non-urgent requests or questions.  Due to long hold times on the telephone, sending your provider a message by [facility] may be a faster and more efficient way to get a response.  Please allow 48 business hours for a response.  Please remember that this is for  non-urgent requests.   Thank you for trusting me with your gastrointestinal care!    Thornton Park, MD, MPH

## 2021-10-22 ENCOUNTER — Telehealth: Payer: Self-pay

## 2021-10-22 ENCOUNTER — Other Ambulatory Visit: Payer: Self-pay

## 2021-10-22 ENCOUNTER — Ambulatory Visit (AMBULATORY_SURGERY_CENTER): Payer: Medicare Other | Admitting: Gastroenterology

## 2021-10-22 ENCOUNTER — Encounter: Payer: Self-pay | Admitting: Gastroenterology

## 2021-10-22 VITALS — BP 114/76 | HR 69 | Temp 97.3°F | Resp 13 | Ht 61.0 in | Wt 126.0 lb

## 2021-10-22 DIAGNOSIS — K621 Rectal polyp: Secondary | ICD-10-CM | POA: Diagnosis not present

## 2021-10-22 DIAGNOSIS — I10 Essential (primary) hypertension: Secondary | ICD-10-CM | POA: Diagnosis not present

## 2021-10-22 DIAGNOSIS — D128 Benign neoplasm of rectum: Secondary | ICD-10-CM | POA: Diagnosis not present

## 2021-10-22 DIAGNOSIS — R194 Change in bowel habit: Secondary | ICD-10-CM

## 2021-10-22 DIAGNOSIS — K5900 Constipation, unspecified: Secondary | ICD-10-CM

## 2021-10-22 DIAGNOSIS — D123 Benign neoplasm of transverse colon: Secondary | ICD-10-CM | POA: Diagnosis not present

## 2021-10-22 MED ORDER — SODIUM CHLORIDE 0.9 % IV SOLN: 500.0000 mL | Freq: Once | INTRAVENOUS | Status: DC

## 2021-10-22 NOTE — Op Note (Signed)
[facility] Patient Name: Patricia Neal Procedure Date: 10/22/2021 9:36 AM MRN: [id number] Endoscopist: Thornton Park MD, MD Age: 71 Referring MD:  Date of Birth: 1950/10/27 Gender: Female Account #: [id number] Procedure:                Colonoscopy Indications:              Change in bowel habits                           Incomplete colonoscopy at [facility] 2009 Medicines:                Monitored Anesthesia Care Procedure:                Pre-Anesthesia Assessment:                           - Prior to the procedure, a History and Physical                            was performed, and patient medications and                            allergies were reviewed. The patient's tolerance of                            previous anesthesia was also reviewed. The risks                            and benefits of the procedure and the sedation                            options and risks were discussed with the patient.                            All questions were answered, and informed consent                            was obtained. Prior Anticoagulants: The patient has                            taken no previous anticoagulant or antiplatelet                            agents. ASA Grade Assessment: II - A patient with                            mild systemic disease. After reviewing the risks                            and benefits, the patient was deemed in                            satisfactory condition to undergo the procedure.  After obtaining informed consent, the colonoscope                            was passed under direct vision. Throughout the                            procedure, the patient's blood pressure, pulse, and                            oxygen saturations were monitored continuously. The                            Olympus CF-HQ190L [id number]) Colonoscope was                            introduced through the anus and advanced to  the 3                            cm into the ileum. A second forward view of the                            right colon was performed. The colonoscopy was                            technically difficult and complex due to a                            redundant colon, significant looping and a tortuous                            colon. Successful completion of the procedure was                            aided by changing the patient's position,                            withdrawing and reinserting the scope and applying                            abdominal pressure. The patient tolerated the                            procedure well. The quality of the bowel                            preparation was excellent. The terminal ileum,                            ileocecal valve, appendiceal orifice, and rectum                            were photographed. Scope In: 9:38:37 AM Scope Out: 9:58:13 AM Scope Withdrawal Time: 0 hours 14 minutes 35 seconds  Total Procedure Duration: 0  hours 19 minutes 36 seconds  Findings:                 The perianal and digital rectal examinations were                            normal.                           Multiple small-mouthed diverticula were found in                            the sigmoid colon.                           A 2 mm polyp was found in the rectum. The polyp was                            sessile. The polyp was removed with a cold snare.                            Resection and retrieval were complete. Estimated                            blood loss was minimal.                           A 15 mm polyp was found in the hepatic flexure. The                            polyp was flat. The polyp was removed with a                            piecemeal technique using a hot snare. Resection                            and retrieval were complete. Estimated blood loss                            was minimal.                           The exam was  otherwise without abnormality on                            direct and retroflexion views. Complications:            No immediate complications. Estimated Blood Loss:     Estimated blood loss was minimal. Impression:               - Diverticulosis in the sigmoid colon.                           - One 2 mm polyp in the rectum, removed with a cold  snare. Resected and retrieved.                           - One 15 mm polyp at the hepatic flexure, removed                            piecemeal using a hot snare. Resected and retrieved.                           - The examination was otherwise normal on direct                            and retroflexion views. Recommendation:           - Patient has a contact number available for                            emergencies. The signs and symptoms of potential                            delayed complications were discussed with the                            patient. Return to normal activities tomorrow.                            Written discharge instructions were provided to the                            patient.                           - Resume previous diet.                           - Continue present medications.                           - Await pathology results.                           - Repeat colonoscopy in 3 years for surveillance.                           - Emerging evidence supports eating a diet of                            fruits, vegetables, grains, calcium, and yogurt                            while reducing red meat and alcohol may reduce the                            risk of colon cancer.                           -  Referral for pelvic floor PT. Thornton Park MD, MD 10/22/2021 10:05:44 AM This report has been signed electronically.

## 2021-10-22 NOTE — Progress Notes (Signed)
Report to PACU, RN, vss, BBS= Clear.  

## 2021-10-22 NOTE — Telephone Encounter (Signed)
-----   Message from Thornton Park, MD sent at 10/22/2021 10:06 AM EDT ----- Referral to Earlie Counts for Pelvic Floor PT.  Thanks.  KLB

## 2021-10-22 NOTE — Progress Notes (Signed)
Indication for colonoscopy: Change in bowel habits  Please see my 10/11/2021 office note for complete details.  There is no change in history or physical exam since that time.  The patient remains an appropriate candidate for monitored anesthesia care in the endoscopy center.

## 2021-10-22 NOTE — Patient Instructions (Signed)
Thank you for coming in to see [place] today! Resume previous diet and medications today. Return to normal activities tomorrow. Recommend a surveillance colonoscopy in 3 years. Dr Tarri Glenn is making a referral for pelvic floor therapy.  Somebody will call you to make this appoint.   YOU HAD AN ENDOSCOPIC PROCEDURE TODAY AT [facility]:   Refer to the procedure report that was given to you for any specific questions about what was found during the examination.  If the procedure report does not answer your questions, please call your gastroenterologist to clarify.  If you requested that your care partner not be given the details of your procedure findings, then the procedure report has been included in a sealed envelope for you to review at your convenience later.  YOU SHOULD EXPECT: Some feelings of bloating in the abdomen. Passage of more gas than usual.  Walking can help get rid of the air that was put into your GI tract during the procedure and reduce the bloating. If you had a lower endoscopy (such as a colonoscopy or flexible sigmoidoscopy) you may notice spotting of blood in your stool or on the toilet paper. If you underwent a bowel prep for your procedure, you may not have a normal bowel movement for a few days.  Please Note:  You might notice some irritation and congestion in your nose or some drainage.  This is from the oxygen used during your procedure.  There is no need for concern and it should clear up in a day or so.  SYMPTOMS TO REPORT IMMEDIATELY:  Following lower endoscopy (colonoscopy or flexible sigmoidoscopy):  Excessive amounts of blood in the stool  Significant tenderness or worsening of abdominal pains  Swelling of the abdomen that is new, acute  Fever of 100F or higher    For urgent or emergent issues, a gastroenterologist can be reached at any hour by calling [phone]. Do not use MyChart messaging for urgent concerns.    DIET:  We do recommend a  small meal at first, but then you may proceed to your regular diet.  Drink plenty of fluids but you should avoid alcoholic beverages for 24 hours.  ACTIVITY:  You should plan to take it easy for the rest of today and you should NOT DRIVE or use heavy machinery until tomorrow (because of the sedation medicines used during the test).    FOLLOW UP: Our staff will call the number listed on your records the next business day following your procedure.  We will call around 7:15- 8:00 am to check on you and address any questions or concerns that you may have regarding the information given to you following your procedure. If we do not reach you, we will leave a message.  If you develop any symptoms (ie: fever, flu-like symptoms, shortness of breath, cough etc.) before then, please call [phone].  If you test positive for Covid 19 in the 2 weeks post procedure, please call and report this information to [place].    If any biopsies were taken you will be contacted by phone or by letter within the next 1-3 weeks.  Please call us at [phone] if you have not heard about the biopsies in 3 weeks.    SIGNATURES/CONFIDENTIALITY: You and/or your care partner have signed paperwork which will be entered into your electronic medical record.  These signatures attest to the fact that that the information above on your After Visit Summary has been reviewed and is understood.  Full responsibility of the confidentiality of this discharge information lies with you and/or your care-partner.  

## 2021-10-22 NOTE — Telephone Encounter (Signed)
Referral placed to pelvic floor PT.

## 2021-10-23 ENCOUNTER — Telehealth: Payer: Self-pay | Admitting: *Deleted

## 2021-10-23 NOTE — Telephone Encounter (Signed)
No answer follow up call. Not able to leave message.

## 2021-10-27 ENCOUNTER — Encounter: Payer: Self-pay | Admitting: Gastroenterology

## 2021-11-11 ENCOUNTER — Ambulatory Visit
Admission: RE | Admit: 2021-11-11 | Discharge: 2021-11-11 | Disposition: A | Discharge status: Home / Self Care | Payer: Medicare Other | Source: Ambulatory Visit | Attending: Internal Medicine | Admitting: Internal Medicine

## 2021-11-11 DIAGNOSIS — E7800 Pure hypercholesterolemia, unspecified: Secondary | ICD-10-CM

## 2021-11-11 DIAGNOSIS — I700 Atherosclerosis of aorta: Secondary | ICD-10-CM | POA: Diagnosis not present

## 2021-11-12 NOTE — Therapy (Unsigned)
  OUTPATIENT PHYSICAL THERAPY FEMALE PELVIC EVALUATION   Patient Name: Patricia Neal MRN: [id number] DOB:26-Oct-1950, 71 y.o., female Today's Date: 11/12/2021    Past Medical History:  Diagnosis Date   Cataract    Hypertension    Osteoporosis    Sinusitis    Past Surgical History:  Procedure Laterality Date   COLONOSCOPY  08/19/2007   Rogers tortuous colon per patient   Patient Active Problem List   Diagnosis Date Noted   Upper airway cough syndrome 10/16/2020    PCP: Deland Pretty, MD  REFERRING PROVIDER: Thornton Park, MD  REFERRING DIAG:  R19.4 (ICD-10-CM) - Change in bowel habits  K59.00 (ICD-10-CM) - Constipation, unspecified constipation type    THERAPY DIAG:  No diagnosis found.  Rationale for Evaluation and Treatment Rehabilitation  ONSET DATE: 05/2021  SUBJECTIVE:                                                                                                                                                                                           SUBJECTIVE STATEMENT: Trouble having a bowel movement. Stomach felt full. Colonoscopy and had polyps. I am not having issues any more with my bowel movements since my colonoscopy. I am not leaking any stool.   ASSESSMENT:  CLINICAL IMPRESSION: Patient is a 71 y.o. female who was seen today for physical therapy evaluation and treatment for constipation. Patient reports she is not having any constipation issues since her colonoscopy. Therapist and patient decided since she is not having issues she does not need therapy at this time.     PLAN: Patient does not require therapy at this time. Evaluation was not performed due to no need at this time.    Earlie Counts, PT 11/13/21 10:32 AM

## 2021-11-13 ENCOUNTER — Encounter: Payer: Self-pay | Admitting: Physical Therapy

## 2021-11-13 ENCOUNTER — Other Ambulatory Visit: Payer: Self-pay

## 2021-11-13 ENCOUNTER — Ambulatory Visit: Payer: Medicare Other | Attending: Gastroenterology | Admitting: Physical Therapy

## 2021-11-13 DIAGNOSIS — K5900 Constipation, unspecified: Secondary | ICD-10-CM | POA: Insufficient documentation

## 2021-11-13 DIAGNOSIS — R194 Change in bowel habit: Secondary | ICD-10-CM | POA: Insufficient documentation

## 2021-11-20 DIAGNOSIS — E7800 Pure hypercholesterolemia, unspecified: Secondary | ICD-10-CM | POA: Diagnosis not present

## 2021-11-29 ENCOUNTER — Other Ambulatory Visit: Payer: Self-pay | Admitting: Internal Medicine

## 2021-11-29 DIAGNOSIS — R9389 Abnormal findings on diagnostic imaging of other specified body structures: Secondary | ICD-10-CM

## 2021-12-13 ENCOUNTER — Ambulatory Visit (HOSPITAL_COMMUNITY)
Admission: RE | Admit: 2021-12-13 | Discharge: 2021-12-13 | Disposition: A | Discharge status: Home / Self Care | Payer: Medicare Other | Source: Ambulatory Visit | Attending: Internal Medicine | Admitting: Internal Medicine

## 2021-12-13 ENCOUNTER — Encounter (HOSPITAL_COMMUNITY): Payer: Self-pay

## 2021-12-13 DIAGNOSIS — R9389 Abnormal findings on diagnostic imaging of other specified body structures: Secondary | ICD-10-CM | POA: Diagnosis not present

## 2021-12-13 DIAGNOSIS — J9811 Atelectasis: Secondary | ICD-10-CM | POA: Diagnosis not present

## 2022-01-03 DIAGNOSIS — Z23 Encounter for immunization: Secondary | ICD-10-CM | POA: Diagnosis not present

## 2022-03-19 DIAGNOSIS — H1045 Other chronic allergic conjunctivitis: Secondary | ICD-10-CM | POA: Diagnosis not present

## 2022-04-18 DIAGNOSIS — H1045 Other chronic allergic conjunctivitis: Secondary | ICD-10-CM | POA: Diagnosis not present

## 2022-07-30 DIAGNOSIS — Z01419 Encounter for gynecological examination (general) (routine) without abnormal findings: Secondary | ICD-10-CM | POA: Diagnosis not present

## 2022-07-30 DIAGNOSIS — Z1231 Encounter for screening mammogram for malignant neoplasm of breast: Secondary | ICD-10-CM | POA: Diagnosis not present

## 2022-08-01 DIAGNOSIS — H6121 Impacted cerumen, right ear: Secondary | ICD-10-CM | POA: Diagnosis not present

## 2022-08-01 DIAGNOSIS — H938X1 Other specified disorders of right ear: Secondary | ICD-10-CM | POA: Diagnosis not present

## 2022-09-04 DIAGNOSIS — I10 Essential (primary) hypertension: Secondary | ICD-10-CM | POA: Diagnosis not present

## 2022-09-09 DIAGNOSIS — E559 Vitamin D deficiency, unspecified: Secondary | ICD-10-CM | POA: Diagnosis not present

## 2022-09-09 DIAGNOSIS — Z0000 Encounter for general adult medical examination without abnormal findings: Secondary | ICD-10-CM | POA: Diagnosis not present

## 2022-09-09 DIAGNOSIS — Z23 Encounter for immunization: Secondary | ICD-10-CM | POA: Diagnosis not present

## 2022-09-09 DIAGNOSIS — M810 Age-related osteoporosis without current pathological fracture: Secondary | ICD-10-CM | POA: Diagnosis not present

## 2022-09-09 DIAGNOSIS — I10 Essential (primary) hypertension: Secondary | ICD-10-CM | POA: Diagnosis not present

## 2022-09-09 DIAGNOSIS — L309 Dermatitis, unspecified: Secondary | ICD-10-CM | POA: Diagnosis not present

## 2022-09-09 DIAGNOSIS — J302 Other seasonal allergic rhinitis: Secondary | ICD-10-CM | POA: Diagnosis not present

## 2022-09-09 DIAGNOSIS — M8580 Other specified disorders of bone density and structure, unspecified site: Secondary | ICD-10-CM | POA: Diagnosis not present

## 2022-10-21 DIAGNOSIS — E559 Vitamin D deficiency, unspecified: Secondary | ICD-10-CM | POA: Diagnosis not present

## 2022-10-21 DIAGNOSIS — M8589 Other specified disorders of bone density and structure, multiple sites: Secondary | ICD-10-CM | POA: Diagnosis not present

## 2022-10-21 DIAGNOSIS — M810 Age-related osteoporosis without current pathological fracture: Secondary | ICD-10-CM | POA: Diagnosis not present

## 2022-12-27 IMAGING — CT CT PARANASAL SINUSES LIMITED
1 series · 16 of 30 positions shown, 20 images · non-contrast
Comparison: None.

CLINICAL DATA: Upper airway cough syndrome for 1 month

EXAM:
CT PARANASAL SINUS LIMITED WITHOUT CONTRAST
TECHNIQUE: Non-contiguous multidetector CT images of the paranasal sinuses were
obtained in a single plane without contrast.

[Series 5: soft tissue · axial · 0.38mm/px · z∈[-145,-61]mm · 16 of 92 slices shown, 20 images]
[im 4/92  brain]
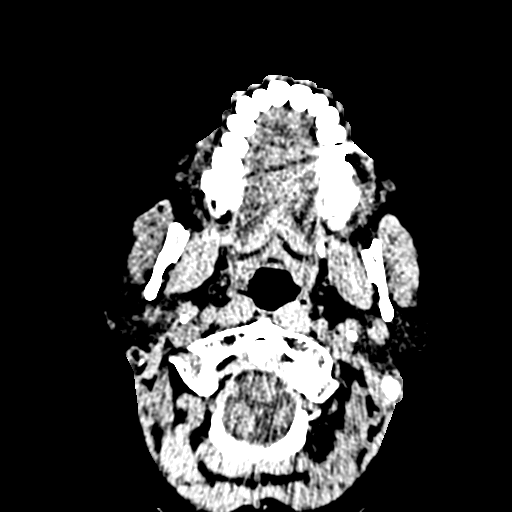
[im 4/92  bone]
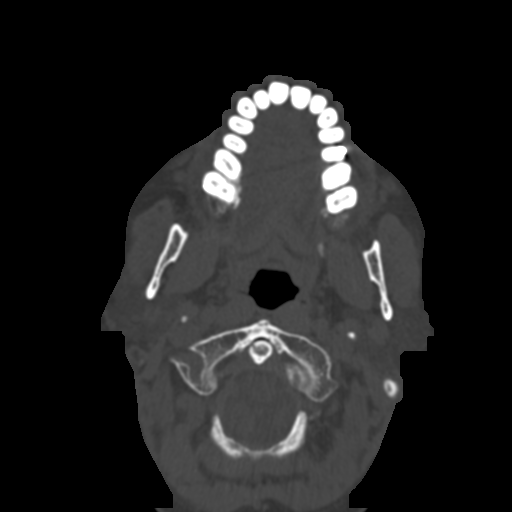
[im 10/92  bone]
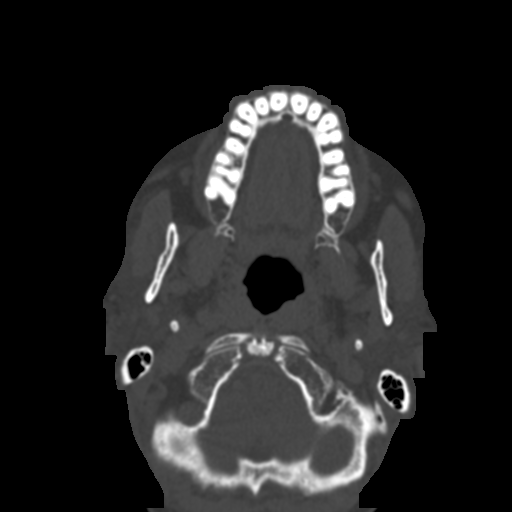
[im 16/92  bone]
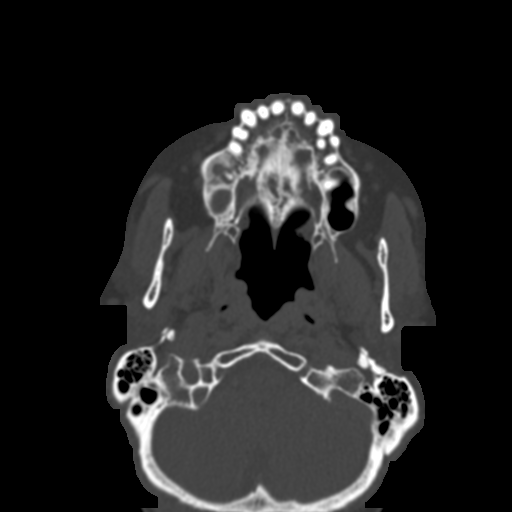
[im 22/92  bone]
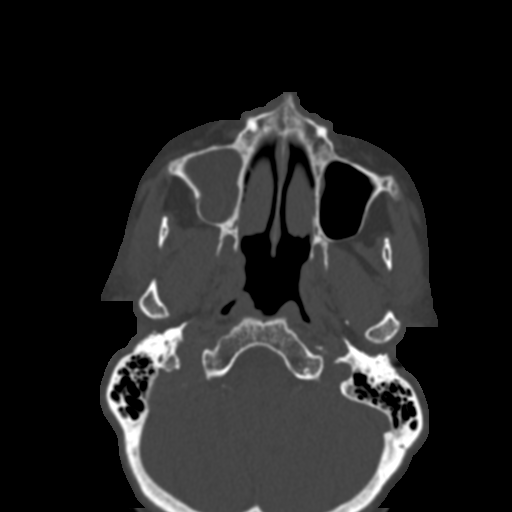
[im 26/92  brain]
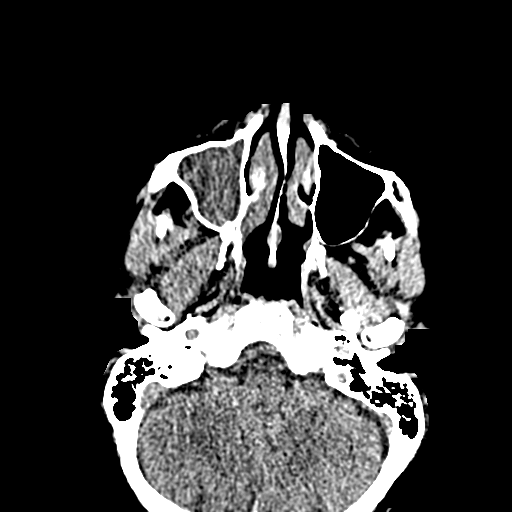
[im 26/92  bone]
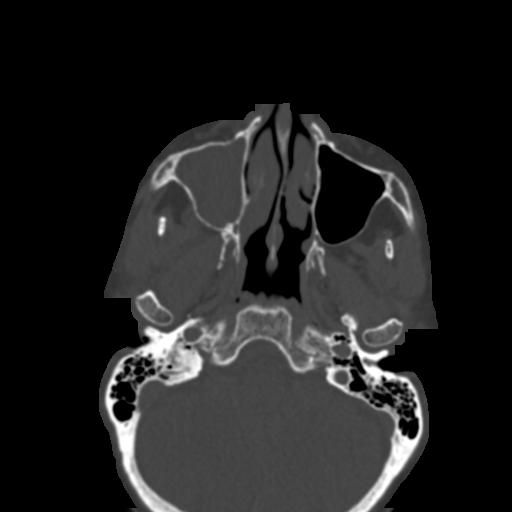
[im 32/92  bone]
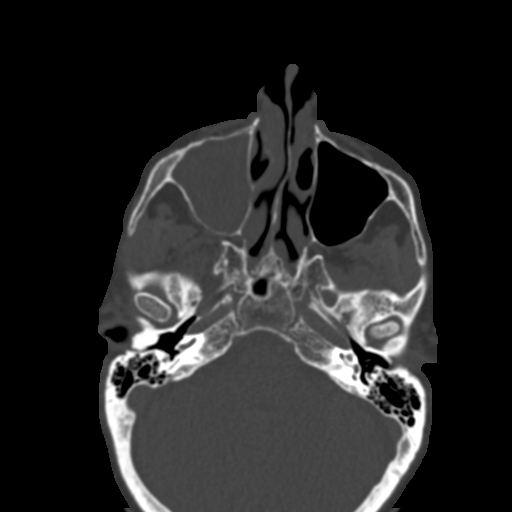
[im 38/92  bone]
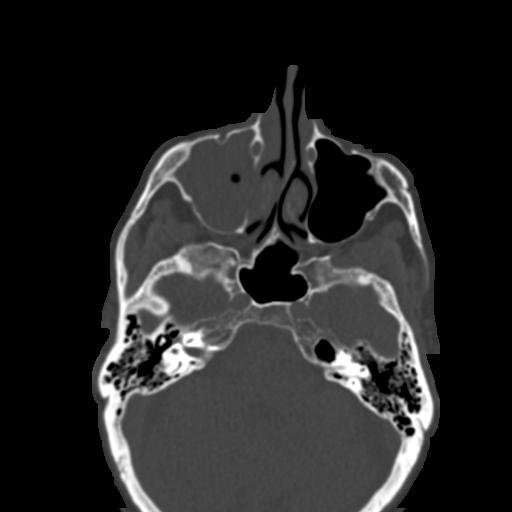
[im 44/92  bone]
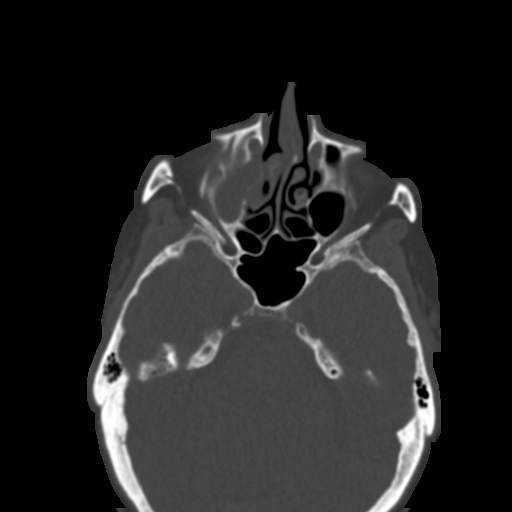
[im 48/92  brain]
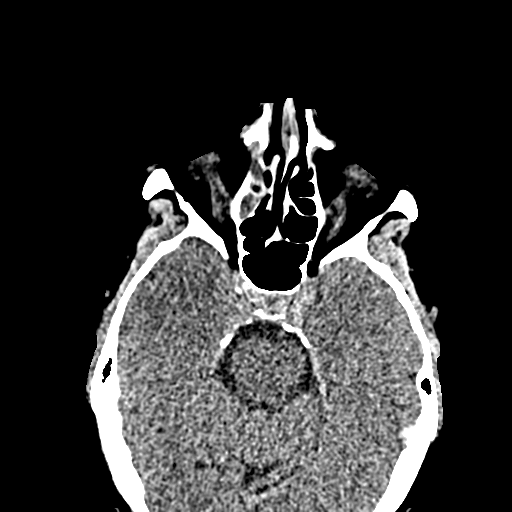
[im 48/92  bone]
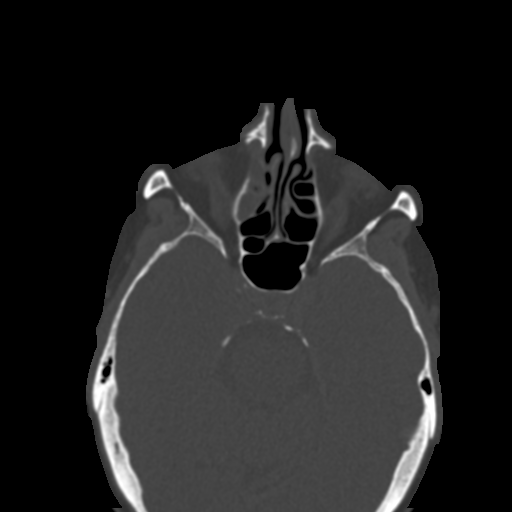
[im 54/92  bone]
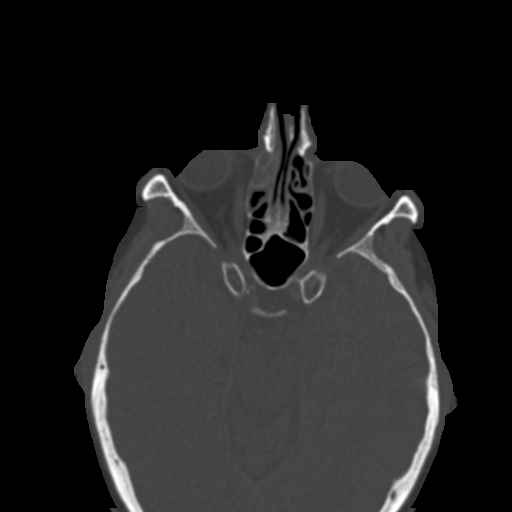
[im 60/92  bone]
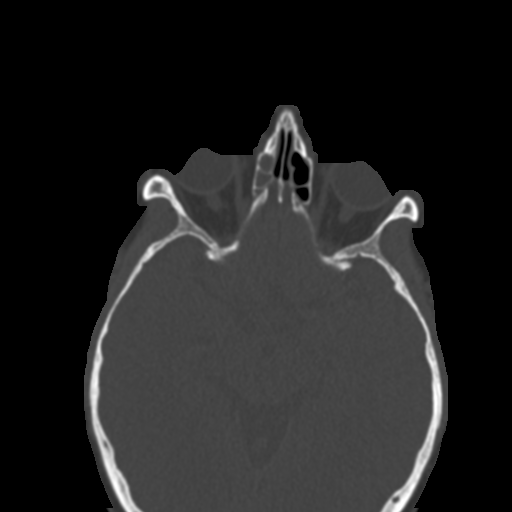
[im 66/92  bone]
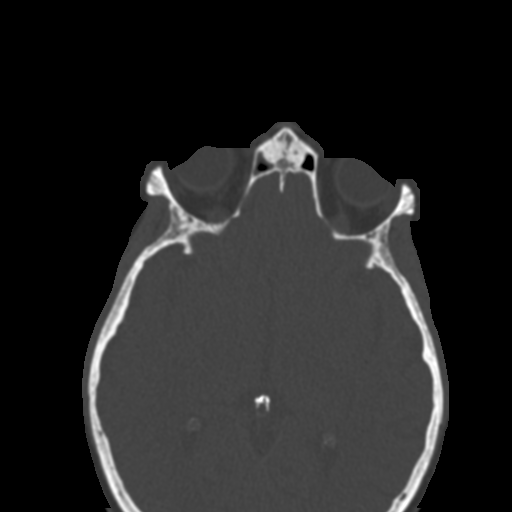
[im 70/92  brain]
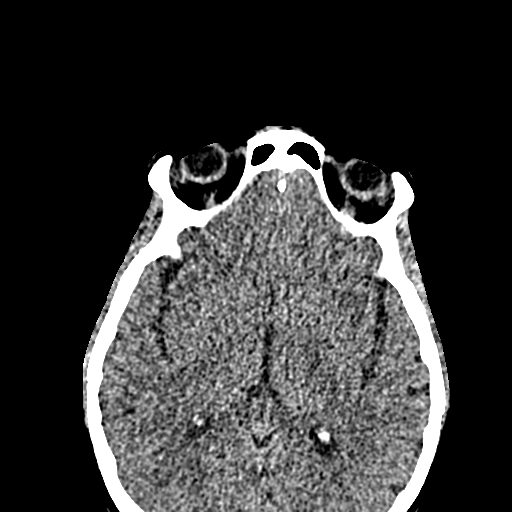
[im 70/92  bone]
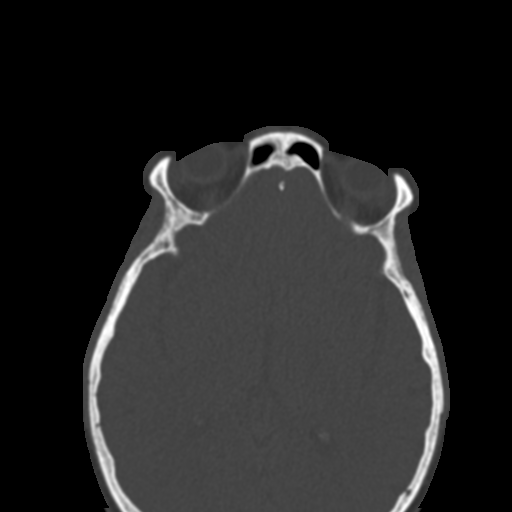
[im 76/92  bone]
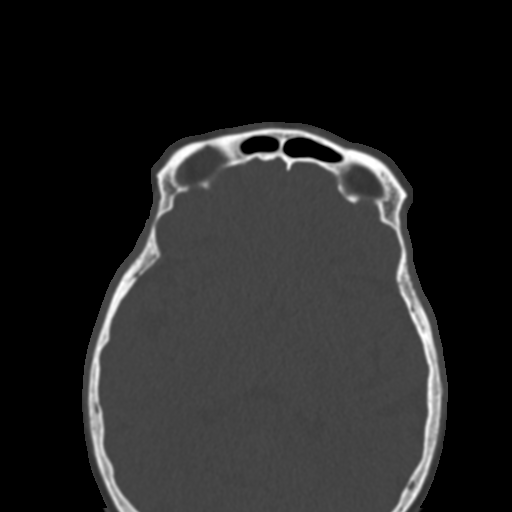
[im 82/92  bone]
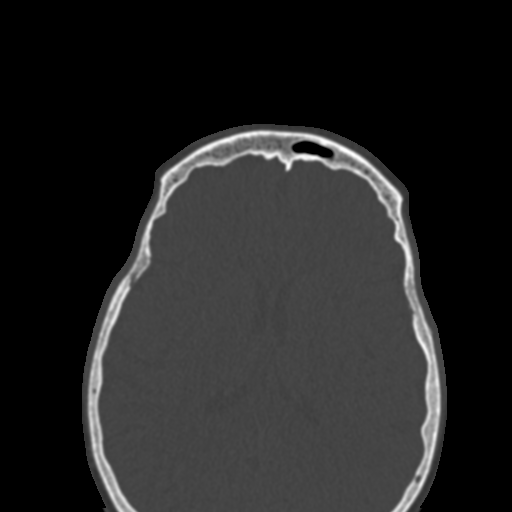
[im 88/92  bone]
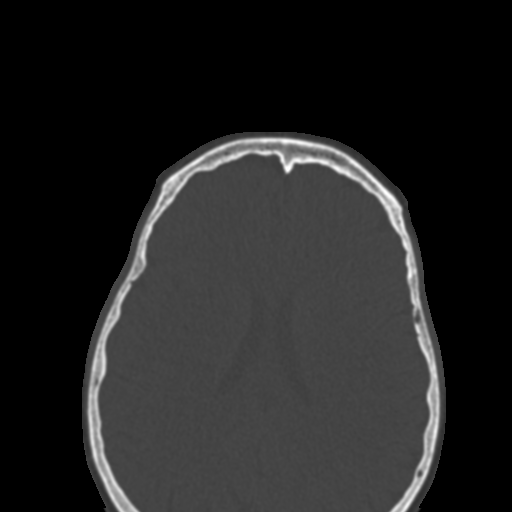

[16 of 30 positions shown; findings below may reference images not displayed]

FINDINGS: Near complete opacification of the right maxillary sinus and
anterior right ethmoids. Right frontal ethmoidal recess
opacification from mucosal thickening. The right maxillary
infundibulum and middle meatus are narrowed by mucosal thickening.
No overt destructive changes.

Contralateral paranasal sinuses are clear.

Congested appearance of nasal mucosa. Leftward nasal septal
deviation contacting the inferior turbinate.

No incidental intracranial, orbital, or soft tissue findings of
note.
IMPRESSION: 1. Right-sided sinusitis with OMU pattern.
2. Periapical erosion to the third tooth which is dehiscent into the
right maxillary sinus.

## 2023-01-05 ENCOUNTER — Other Ambulatory Visit (HOSPITAL_BASED_OUTPATIENT_CLINIC_OR_DEPARTMENT_OTHER): Payer: Self-pay

## 2023-01-05 DIAGNOSIS — Z23 Encounter for immunization: Secondary | ICD-10-CM | POA: Diagnosis not present

## 2023-01-05 MED ORDER — INFLUENZA VAC A&B SURF ANT ADJ 0.5 ML IM SUSY
0.5000 mL | PREFILLED_SYRINGE | Freq: Once | INTRAMUSCULAR | 0 refills | Status: AC
  Filled 2023-01-05: qty 0.5, 1d supply, fill #0

## 2023-03-19 ENCOUNTER — Other Ambulatory Visit: Payer: Self-pay

## 2023-03-19 ENCOUNTER — Emergency Department (HOSPITAL_BASED_OUTPATIENT_CLINIC_OR_DEPARTMENT_OTHER)
Admission: EM | Admit: 2023-03-19 | Discharge: 2023-03-19 | Disposition: A | Discharge status: Home / Self Care | Payer: Medicare Other | Attending: Emergency Medicine | Admitting: Emergency Medicine

## 2023-03-19 ENCOUNTER — Other Ambulatory Visit (HOSPITAL_BASED_OUTPATIENT_CLINIC_OR_DEPARTMENT_OTHER): Payer: Self-pay

## 2023-03-19 ENCOUNTER — Emergency Department (HOSPITAL_BASED_OUTPATIENT_CLINIC_OR_DEPARTMENT_OTHER): Payer: Medicare Other | Admitting: Radiology

## 2023-03-19 ENCOUNTER — Encounter (HOSPITAL_BASED_OUTPATIENT_CLINIC_OR_DEPARTMENT_OTHER): Payer: Self-pay | Admitting: Emergency Medicine

## 2023-03-19 DIAGNOSIS — Z79899 Other long term (current) drug therapy: Secondary | ICD-10-CM | POA: Diagnosis not present

## 2023-03-19 DIAGNOSIS — I10 Essential (primary) hypertension: Secondary | ICD-10-CM | POA: Diagnosis not present

## 2023-03-19 DIAGNOSIS — R030 Elevated blood-pressure reading, without diagnosis of hypertension: Secondary | ICD-10-CM | POA: Diagnosis not present

## 2023-03-19 DIAGNOSIS — M549 Dorsalgia, unspecified: Secondary | ICD-10-CM

## 2023-03-19 DIAGNOSIS — M545 Low back pain, unspecified: Secondary | ICD-10-CM | POA: Diagnosis not present

## 2023-03-19 DIAGNOSIS — M546 Pain in thoracic spine: Secondary | ICD-10-CM | POA: Diagnosis not present

## 2023-03-19 DIAGNOSIS — R0989 Other specified symptoms and signs involving the circulatory and respiratory systems: Secondary | ICD-10-CM | POA: Diagnosis not present

## 2023-03-19 LAB — CBC WITH DIFFERENTIAL/PLATELET
Abs Immature Granulocytes: 0.01 10*3/uL (ref 0.00–0.07)
Basophils Absolute: 0 10*3/uL (ref 0.0–0.1)
Basophils Relative: 1 %
Eosinophils Absolute: 0.1 10*3/uL (ref 0.0–0.5)
Eosinophils Relative: 1 %
HCT: 39.8 % (ref 36.0–46.0)
Hemoglobin: 13.3 g/dL (ref 12.0–15.0)
Immature Granulocytes: 0 %
Lymphocytes Relative: 34 %
Lymphs Abs: 2.3 10*3/uL (ref 0.7–4.0)
MCH: 27.8 pg (ref 26.0–34.0)
MCHC: 33.4 g/dL (ref 30.0–36.0)
MCV: 83.1 fL (ref 80.0–100.0)
Monocytes Absolute: 0.4 10*3/uL (ref 0.1–1.0)
Monocytes Relative: 6 %
Neutro Abs: 3.8 10*3/uL (ref 1.7–7.7)
Neutrophils Relative %: 58 %
Platelets: 318 10*3/uL (ref 150–400)
RBC: 4.79 MIL/uL (ref 3.87–5.11)
RDW: 13.6 % (ref 11.5–15.5)
WBC: 6.6 10*3/uL (ref 4.0–10.5)
nRBC: 0 % (ref 0.0–0.2)

## 2023-03-19 LAB — BASIC METABOLIC PANEL
Anion gap: 8 (ref 5–15)
BUN: 16 mg/dL (ref 8–23)
CO2: 26 mmol/L (ref 22–32)
Calcium: 8.9 mg/dL (ref 8.9–10.3)
Chloride: 105 mmol/L (ref 98–111)
Creatinine, Ser: 0.68 mg/dL (ref 0.44–1.00)
GFR, Estimated: >60 mL/min (ref >60)
Glucose, Bld: 99 mg/dL (ref 70–99)
Potassium: 4 mmol/L (ref 3.5–5.1)
Sodium: 139 mmol/L (ref 135–145)

## 2023-03-19 LAB — TROPONIN I (HIGH SENSITIVITY): Troponin I (High Sensitivity): 2 ng/L (ref <18)

## 2023-03-19 MED ORDER — LIDOCAINE 5 % EX PTCH
1.0000 | MEDICATED_PATCH | CUTANEOUS | 0 refills | Status: AC
  Filled 2023-03-19: qty 14, 14d supply, fill #0

## 2023-03-19 MED ORDER — LIDOCAINE 5 % EX PTCH
1.0000 | MEDICATED_PATCH | Freq: Once | CUTANEOUS | Status: DC
  Administered 2023-03-19: 1 via TRANSDERMAL
  Filled 2023-03-19: qty 1

## 2023-03-19 NOTE — ED Notes (Signed)
 Pt given discharge instructions and reviewed prescriptions. Opportunities given for questions. Pt verbalizes understanding. PIV removed x1. Jillyn Hidden, RN

## 2023-03-19 NOTE — ED Provider Notes (Signed)
 [facility]GENC[facility] DEPARTMENT AT [facility] Provider Note   CSN: [id number] Arrival date & time: 03/19/23  0537     History  Chief Complaint  Patient presents with   Hypertension    Patricia Neal is a 73 y.o. female.  Patient with history of hypertension on losartan presents to the emergency department for evaluation of hypertension and back pain.  She reports that her blood pressures have been running high over the past week.  Normally systolic blood pressures in the 130s, over the past week her blood pressures have been 160 over 90s.  She has been compliant with her medications.  She reports that in the past day or so she developed intermittent pain in the mid back.  It is over the midline area and described as a pressure.  It is not really change with position but does feel worse when she pushes her chest out.  She does not have any anterior chest pain or shortness of breath with this.  No weakness, numbness, or tingling in the arms of the legs.  No strokelike symptoms.  Patient denies history of heart problems.  No fevers or cough.       Home Medications Prior to Admission medications   Medication Sig Start Date End Date Taking? Authorizing Provider  cholecalciferol (VITAMIN D3) 25 MCG (1000 UNIT) tablet Take 1,000 Units by mouth daily.    [provider]  losartan (COZAAR) 50 MG tablet Take 50 mg by mouth daily.    [provider]  Multiple Vitamin (MULTIVITAMIN) capsule Take 1 capsule by mouth as needed.    [provider]  polyethylene glycol (MIRALAX / GLYCOLAX) 17 g packet Take 17 g by mouth every other day.    [provider]      Allergies    Aspirin    Review of Systems   Review of Systems  Physical Exam Updated Vital Signs BP (!) 152/98   Pulse 77   Temp 98 F (36.7 C)   Resp 13   SpO2 97%   Physical Exam Vitals and nursing note reviewed.  Constitutional:      Appearance: She is well-developed. She  is not diaphoretic.     Comments: She appears comfortable sitting in bed.  HENT:     Head: Normocephalic and atraumatic.     Mouth/Throat:     Mouth: Mucous membranes are moist. Mucous membranes are not dry.  Eyes:     Conjunctiva/sclera: Conjunctivae normal.  Neck:     Vascular: Normal carotid pulses. No JVD.     Trachea: Trachea normal. No tracheal deviation.  Cardiovascular:     Rate and Rhythm: Normal rate and regular rhythm.     Pulses: No decreased pulses.          Radial pulses are 2+ on the right side and 2+ on the left side.     Heart sounds: Normal heart sounds, S1 normal and S2 normal. No murmur heard.    Comments: Regular rhythm, no murmurs heard. Pulmonary:     Effort: Pulmonary effort is normal. No respiratory distress.     Breath sounds: No wheezing.  Chest:     Chest wall: No tenderness.  Abdominal:     General: Bowel sounds are normal.     Palpations: Abdomen is soft.     Tenderness: There is no abdominal tenderness. There is no guarding or rebound.  Musculoskeletal:        General: Normal range of motion.  Cervical back: Normal range of motion and neck supple. No bony tenderness. No muscular tenderness. Normal range of motion.     Thoracic back: No tenderness or bony tenderness. Normal range of motion.     Lumbar back: No tenderness or bony tenderness. Normal range of motion.     Right lower leg: No edema.     Left lower leg: No edema.     Comments: No lower extremity edema  Skin:    General: Skin is warm and dry.     Coloration: Skin is not pale.  Neurological:     Mental Status: She is alert.     ED Results / Procedures / Treatments   Labs (all labs ordered are listed, but only abnormal results are displayed) Labs Reviewed - No data to display  ED ECG REPORT   Date: 03/19/2023  Rate: 74  Rhythm: normal sinus rhythm  QRS Axis: normal  Intervals: normal  ST/T Wave abnormalities: nonspecific T wave changes  Conduction Disutrbances:none   Narrative Interpretation:   Old EKG Reviewed: none available  I have personally reviewed the EKG tracing and agree with the computerized printout as noted.   Radiology No results found.  Procedures Procedures    Medications Ordered in ED Medications  lidocaine  (LIDODERM ) 5 % 1 patch (1 patch Transdermal Patch Applied 03/19/23 1033)    ED Course/ Medical Decision Making/ A&P    Patient seen and examined. History obtained directly from patient.   Labs/EKG: EKG personally reviewed and interpreted as above.  Given the patient's age and history of hypertension, will evaluate for atypical chest pain with CBC, BMP, troponin x 1.  Imaging: Ordered chest x-ray.  Medications/Fluids: None ordered  Most recent vital signs reviewed and are as follows: BP (!) 152/98   Pulse 77   Temp 98 F (36.7 C)   Resp 13   SpO2 97%   Initial impression: In regards to the blood pressure, it is minimally elevated.  Patient will continue her antihypertensives and follow-up with PCP.  Regards to the back pain, it is difficult to say exactly what this is.  Patient did have a CT scan of the chest just over a year ago.  At that time, she had some aortic atherosclerosis but no signs of an aneurysm in the chest.  She does not have any extremity symptoms or strokelike symptoms or abdominal pain to suggest a dissection or aneurysm today.  I have very low suspicion for life-threatening problem based on her exam.  Will reassess.    Reassessment performed. Patient appears stable.  She is feeling much better after application of Lidoderm  patch.  Labs personally reviewed and interpreted including: CBC, BMP, troponin were all unremarkable.  Imaging personally visualized and interpreted including: Chest x-ray, agree negative without any spine abnormalities as well.  Reviewed pertinent lab work and imaging with patient at bedside. Questions answered.   Most current vital signs reviewed and are as follows: BP (!)  135/91   Pulse 75   Temp 98.4 F (36.9 C) (Oral)   Resp 18   SpO2 97%   Plan: Discharge to home.   Prescriptions written for: Lidoderm   Return and follow-up instructions: I encouraged patient to return to ED with severe chest pain, especially if the pain is crushing or pressure-like and spreads to the arms, back, neck, or jaw, or if they have associated sweating, vomiting, or shortness of breath with the pain, or significant pain with activity. We discussed that the evaluation here today  indicates a low-risk of serious cause of chest pain, including heart trouble or a blood clot, but no evaluation is perfect and chest pain can evolve with time. The patient verbalized understanding and agreed.  I encouraged patient to follow-up with their provider in the next 1 week for recheck.                                     Medical Decision Making Amount and/or Complexity of Data Reviewed Labs: ordered. Radiology: ordered.  Risk Prescription drug management.   Patient with mid back pain. No neurological deficits. Patient is ambulatory. No warning symptoms of back pain including: fecal incontinence, urinary retention or overflow incontinence, night sweats, waking from sleep with back pain, unexplained fevers or weight loss, h/o cancer, IVDU, recent trauma. No concern for cauda equina, epidural abscess, or other serious cause of back pain. Conservative measures such as rest, ice/heat and pain medicine indicated with PCP follow-up if no improvement with conservative management.   Did also consider possibility of atypical chest pain.  EKG, troponin, lab workup is reassuring.  Patient does not have any vascular or strokelike symptoms, associated anterior chest pain or abdominal pain to suggest dissection.  Symptoms were improved in the ED with application of Lidoderm  patch.  At this point symptoms most likely musculoskeletal in nature, but would benefit from follow-up if not improving.  The patient's  vital signs, pertinent lab work and imaging were reviewed and interpreted as discussed in the ED course. Hospitalization was considered for further testing, treatments, or serial exams/observation. However as patient is well-appearing, has a stable exam, and reassuring studies today, I do not feel that they warrant admission at this time. This plan was discussed with the patient who verbalizes agreement and comfort with this plan and seems reliable and able to return to the Emergency Department with worsening or changing symptoms.          Final Clinical Impression(s) / ED Diagnoses Final diagnoses:  Mid back pain  Elevated blood pressure reading    Rx / DC Orders ED Discharge Orders          Ordered    lidocaine  (LIDODERM ) 5 %  Every 24 hours        03/19/23 1140              Desiderio Chew, PA-C 03/19/23 1546    Mannie Pac T, DO 03/20/23 [phone]

## 2023-03-19 NOTE — ED Triage Notes (Signed)
 Pt reports HTN. Denies headache, blurry vision, etc. Reports a sharp pain in the middle of her back as well.

## 2023-03-19 NOTE — Discharge Instructions (Addendum)
 Please read and follow all provided instructions.  Your diagnoses today include:  1. Mid back pain   2. Elevated blood pressure reading     Tests performed today include: An EKG of your heart A chest x-ray: No problems with the lungs or the visible spine Cardiac enzymes - a blood test for heart muscle damage Blood counts and electrolytes: Were normal Vital signs. See below for your results today.   Medications prescribed:  Lidoderm  patch  Take any prescribed medications only as directed.  Follow-up instructions: Please follow-up with your primary care provider as soon as you can for further evaluation of your symptoms.   Return instructions:  SEEK IMMEDIATE MEDICAL ATTENTION IF: You have severe chest pain, especially if the pain is crushing or pressure-like and spreads to the arms, back, neck, or jaw, or if you have sweating, nausea or vomiting, or trouble with breathing. THIS IS AN EMERGENCY. Do not wait to see if the pain will go away. Get medical help at once. Call 911. DO NOT drive yourself to the hospital.  Your chest pain gets worse and does not go away after a few minutes of rest.  You have an attack of chest pain lasting longer than what you usually experience.  You have significant dizziness, if you pass out, or have trouble walking.  You have chest pain not typical of your usual pain for which you originally saw your caregiver.  You have any other emergent concerns regarding your health.  Your vital signs today were: BP (!) 135/91   Pulse 75   Temp 98.4 F (36.9 C) (Oral)   Resp 18   SpO2 97%  If your blood pressure (BP) was elevated above 135/85 this visit, please have this repeated by your doctor within one month. --------------

## 2023-03-25 DIAGNOSIS — I10 Essential (primary) hypertension: Secondary | ICD-10-CM | POA: Diagnosis not present

## 2023-03-31 ENCOUNTER — Telehealth: Payer: Self-pay

## 2023-03-31 NOTE — Progress Notes (Signed)
 Transition Care Management Unsuccessful Follow-up Telephone Call  Date of discharge and from where:  [facility]  Attempts:  1st Attempt  Reason for unsuccessful TCM follow-up call:  No answer/busy   [facility]  [facility], [facility] Guide, Phone: [phone] Website: [email or web address]

## 2023-03-31 NOTE — Progress Notes (Signed)
 Transition Care Management Unsuccessful Follow-up Telephone Call  Date of discharge and from where:  [facility]  Attempts:  2nd Attempt  Reason for unsuccessful TCM follow-up call:  No answer/busy   Patricia [facility]  [facility], [facility] Guide, Phone: [phone] Website: [email or web address]

## 2023-06-29 DIAGNOSIS — Z6823 Body mass index (BMI) 23.0-23.9, adult: Secondary | ICD-10-CM | POA: Diagnosis not present

## 2023-06-29 DIAGNOSIS — R051 Acute cough: Secondary | ICD-10-CM | POA: Diagnosis not present

## 2023-06-29 DIAGNOSIS — J0100 Acute maxillary sinusitis, unspecified: Secondary | ICD-10-CM | POA: Diagnosis not present

## 2023-07-31 DIAGNOSIS — Z1231 Encounter for screening mammogram for malignant neoplasm of breast: Secondary | ICD-10-CM | POA: Diagnosis not present

## 2023-08-08 DIAGNOSIS — J019 Acute sinusitis, unspecified: Secondary | ICD-10-CM | POA: Diagnosis not present

## 2023-08-08 DIAGNOSIS — I10 Essential (primary) hypertension: Secondary | ICD-10-CM | POA: Diagnosis not present

## 2023-08-08 DIAGNOSIS — B9689 Other specified bacterial agents as the cause of diseases classified elsewhere: Secondary | ICD-10-CM | POA: Diagnosis not present

## 2023-09-07 DIAGNOSIS — E7800 Pure hypercholesterolemia, unspecified: Secondary | ICD-10-CM | POA: Diagnosis not present

## 2023-09-07 DIAGNOSIS — M810 Age-related osteoporosis without current pathological fracture: Secondary | ICD-10-CM | POA: Diagnosis not present

## 2023-09-07 DIAGNOSIS — D75839 Thrombocytosis, unspecified: Secondary | ICD-10-CM | POA: Diagnosis not present

## 2023-09-07 DIAGNOSIS — I10 Essential (primary) hypertension: Secondary | ICD-10-CM | POA: Diagnosis not present

## 2023-09-11 DIAGNOSIS — M810 Age-related osteoporosis without current pathological fracture: Secondary | ICD-10-CM | POA: Diagnosis not present

## 2023-09-11 DIAGNOSIS — R053 Chronic cough: Secondary | ICD-10-CM | POA: Diagnosis not present

## 2023-09-11 DIAGNOSIS — Z0000 Encounter for general adult medical examination without abnormal findings: Secondary | ICD-10-CM | POA: Diagnosis not present

## 2023-09-11 DIAGNOSIS — D485 Neoplasm of uncertain behavior of skin: Secondary | ICD-10-CM | POA: Diagnosis not present

## 2023-09-11 DIAGNOSIS — J302 Other seasonal allergic rhinitis: Secondary | ICD-10-CM | POA: Diagnosis not present

## 2023-09-11 DIAGNOSIS — I10 Essential (primary) hypertension: Secondary | ICD-10-CM | POA: Diagnosis not present

## 2023-11-27 DIAGNOSIS — Z23 Encounter for immunization: Secondary | ICD-10-CM | POA: Diagnosis not present
